# Patient Record
Sex: Male | Born: 1980 | Race: White | Hispanic: No | Marital: Single | Smoking: Current every day smoker
Health system: Southern US, Community
[De-identification: ages and names within clinical notes are randomized; demographics above are authoritative.]

---

## 2008-03-31 ENCOUNTER — Emergency Department (HOSPITAL_BASED_OUTPATIENT_CLINIC_OR_DEPARTMENT_OTHER): Admission: EM | Admit: 2008-03-31 | Discharge: 2008-03-31 | Payer: Self-pay | Admitting: Emergency Medicine

## 2008-07-14 ENCOUNTER — Emergency Department (HOSPITAL_BASED_OUTPATIENT_CLINIC_OR_DEPARTMENT_OTHER): Admission: EM | Admit: 2008-07-14 | Discharge: 2008-07-14 | Payer: Self-pay | Admitting: Emergency Medicine

## 2008-12-27 ENCOUNTER — Ambulatory Visit: Payer: Self-pay | Admitting: Radiology

## 2008-12-27 ENCOUNTER — Emergency Department (HOSPITAL_BASED_OUTPATIENT_CLINIC_OR_DEPARTMENT_OTHER): Admission: EM | Admit: 2008-12-27 | Discharge: 2008-12-27 | Payer: Self-pay | Admitting: Emergency Medicine

## 2009-01-03 ENCOUNTER — Emergency Department (HOSPITAL_BASED_OUTPATIENT_CLINIC_OR_DEPARTMENT_OTHER): Admission: EM | Admit: 2009-01-03 | Discharge: 2009-01-03 | Payer: Self-pay | Admitting: Emergency Medicine

## 2009-05-24 ENCOUNTER — Ambulatory Visit: Payer: Self-pay | Admitting: Family Medicine

## 2009-05-24 DIAGNOSIS — S61409A Unspecified open wound of unspecified hand, initial encounter: Secondary | ICD-10-CM | POA: Insufficient documentation
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
## 2009-08-05 ENCOUNTER — Emergency Department (HOSPITAL_BASED_OUTPATIENT_CLINIC_OR_DEPARTMENT_OTHER): Admission: EM | Admit: 2009-08-05 | Discharge: 2009-08-05 | Payer: Self-pay | Admitting: Emergency Medicine

## 2010-12-05 LAB — URINALYSIS, ROUTINE W REFLEX MICROSCOPIC
Glucose, UA: NEGATIVE mg/dL
Hgb urine dipstick: NEGATIVE
Ketones, ur: NEGATIVE mg/dL
Protein, ur: NEGATIVE mg/dL

## 2010-12-05 LAB — DIFFERENTIAL
Basophils Relative: 2 % — ABNORMAL HIGH (ref 0–1)
Lymphs Abs: 1.9 10*3/uL (ref 0.7–4.0)
Monocytes Relative: 9 % (ref 3–12)
Neutro Abs: 3.7 10*3/uL (ref 1.7–7.7)
Neutrophils Relative %: 59 % (ref 43–77)

## 2010-12-05 LAB — COMPREHENSIVE METABOLIC PANEL
BUN: 13 mg/dL (ref 6–23)
Calcium: 9.6 mg/dL (ref 8.4–10.5)
Glucose, Bld: 94 mg/dL (ref 70–99)
Total Protein: 7.6 g/dL (ref 6.0–8.3)

## 2010-12-05 LAB — PROTIME-INR
INR: 1 (ref 0.00–1.49)
Prothrombin Time: 13 seconds (ref 11.6–15.2)

## 2010-12-05 LAB — CBC
HCT: 44.7 % (ref 39.0–52.0)
Hemoglobin: 15.2 g/dL (ref 13.0–17.0)
MCHC: 34 g/dL (ref 30.0–36.0)
MCV: 92.9 fL (ref 78.0–100.0)
RDW: 12 % (ref 11.5–15.5)

## 2010-12-05 LAB — LIPASE, BLOOD: Lipase: 307 U/L — ABNORMAL HIGH (ref 23–300)

## 2011-05-29 LAB — OCCULT BLOOD X 1 CARD TO LAB, STOOL: Fecal Occult Bld: NEGATIVE

## 2011-08-19 ENCOUNTER — Emergency Department: Admit: 2011-08-19 | Discharge: 2011-08-19 | Disposition: A | Discharge status: Home / Self Care | Payer: Medicaid Other

## 2011-08-19 ENCOUNTER — Emergency Department (INDEPENDENT_AMBULATORY_CARE_PROVIDER_SITE_OTHER)
Admission: EM | Admit: 2011-08-19 | Discharge: 2011-08-19 | Disposition: A | Discharge status: Other Acute Inpatient Hospital | Payer: Medicaid Other | Source: Home / Self Care | Attending: Emergency Medicine | Admitting: Emergency Medicine

## 2011-08-19 ENCOUNTER — Encounter: Payer: Self-pay | Admitting: Emergency Medicine

## 2011-08-19 DIAGNOSIS — M25569 Pain in unspecified knee: Secondary | ICD-10-CM

## 2011-08-19 DIAGNOSIS — S8392XA Sprain of unspecified site of left knee, initial encounter: Secondary | ICD-10-CM

## 2011-08-19 DIAGNOSIS — IMO0002 Reserved for concepts with insufficient information to code with codable children: Secondary | ICD-10-CM

## 2011-08-19 DIAGNOSIS — M25562 Pain in left knee: Secondary | ICD-10-CM

## 2011-08-19 MED ORDER — HYDROCODONE-ACETAMINOPHEN 5-500 MG PO TABS: ORAL_TABLET | ORAL | Status: AC

## 2011-08-19 MED ORDER — ETODOLAC 500 MG PO TABS: 500.0000 mg | ORAL_TABLET | Freq: Two times a day (BID) | ORAL | Status: AC

## 2011-08-19 NOTE — ED Provider Notes (Signed)
History     CSN: [id number]  Arrival date & time 08/19/11  1324   First MD Initiated Contact with Patient 08/19/11 1522      Chief Complaint  Patient presents with  . Knee Pain     Patient is a 30 y.o. male presenting with knee pain. The history is provided by the patient and the spouse.  Knee Pain The current episode started 12 to 24 hours ago. The problem has been gradually worsening (Worsening swelling left knee). Pertinent negatives include no chest pain, no abdominal pain, no headaches and no shortness of breath. The symptoms are aggravated by bending and twisting. The symptoms are relieved by rest. He has tried nothing for the symptoms.   Yesterday, while walking and climbing on the roof he felt left knee pop. The pain has gradually worsened. It's sharp and dull. No radiation. Mild worsening swelling left knee overnight. History reviewed. No pertinent past medical history. Never had any diagnosed with left knee problems in the past. History reviewed. No pertinent past surgical history.  Family History  Problem Relation Age of Onset  . Hypertension Mother   . Hypertension Father     History  Substance Use Topics  . Smoking status: Current Everyday Smoker -- 1.0 packs/day    Types: Cigarettes  . Smokeless tobacco: Not on file  . Alcohol Use: Yes      Review of Systems  Respiratory: Negative for shortness of breath.   Cardiovascular: Negative for chest pain.  Gastrointestinal: Negative for abdominal pain.  Neurological: Negative for headaches.  All other systems reviewed and are negative.    Allergies  Review of patient's allergies indicates no known allergies.  Home Medications   Current Outpatient Rx  Name Route Sig Dispense Refill  . ETODOLAC 500 MG PO TABS Oral Take 1 tablet (500 mg total) by mouth 2 (two) times daily with a meal. As needed for pain. 20 tablet 0  . HYDROCODONE-ACETAMINOPHEN 5-500 MG PO TABS  Take 1 or 2 every 4-6 hours as needed for  severe pain 12 tablet 0    BP 162/97  Pulse 77  Temp(Src) 98.6 F (37 C) (Oral)  Resp 16  Ht 5\' 11"  (1.803 m)  Wt 210 lb (95.255 kg)  BMI 29.29 kg/m2  SpO2 100%  Physical Exam  Nursing note and vitals reviewed. Constitutional: He is oriented to person, place, and time. He appears well-developed and well-nourished. No distress.       Very uncomfortable from left knee pain, splinting his left knee. Unable to weight-bear on left knee without severe pain.  HENT:  Head: Normocephalic and atraumatic.  Eyes: Conjunctivae and EOM are normal. Pupils are equal, round, and reactive to light. No scleral icterus.  Neck: Normal range of motion.  Cardiovascular: Normal rate.   Pulmonary/Chest: Effort normal.  Abdominal: He exhibits no distension.  Musculoskeletal:       Left knee: He exhibits decreased range of motion and swelling. He exhibits no laceration and no erythema. tenderness found. Medial joint line tenderness noted.       Mildly positive McMurray sign left knee. Negative anterior drawer sign left knee. There is a minimal effusion left knee that ballots.  Neurological: He is alert and oriented to person, place, and time.  Skin: Skin is warm.  Psychiatric: He has a normal mood and affect.   no skin changes suggestive of cellulitis or infection. No fluctuance or heat.  ED Course  Procedures (including critical care time) 9:22 PM--x-ray left  knee ordered  Labs Reviewed - No data to display Dg Knee Complete 4 Views Left  08/19/2011  *RADIOLOGY REPORT*  Clinical Data: Injury 1 day ago.  Pain.  LEFT KNEE - COMPLETE 4+ VIEW  Comparison: None.  Findings: The patient has a small to moderate joint effusion.  No fracture or dislocation is identified.  IMPRESSION: Joint effusion.  Otherwise negative.  Original Report Authenticated By: Bernadene Bell. D'ALESSIO, M.D.     1. Knee pain, left   2. Sprain of left knee       MDM  May have a simple sprain of left knee, but I'm concerned he may  have medial meniscus injury, or possibly torn internal ligament such as anterior cruciate ligament. Discussed this and the negative left knee x-ray with patient and wife. Placed him in a knee immobilizer. Prescribed Lodine and a small amount of Vicodin when necessary pain. Precautions discussed. EncourGave him names and numbers of orthopedist. Advised to followup with an orthopedist in 3-5 days, sooner when necessary. In the meantime, wear R knee brace and right leg. rest, ice, compression with ACE bandage, and elevation of injured body part.  I told him and wife that if he is unable to get an appointment with an orthopedist in the next 3 days, then our office will assist with referral to an orthopedist          Lonell Face, MD 08/19/11 2126

## 2011-08-19 NOTE — ED Notes (Signed)
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

## 2011-08-22 ENCOUNTER — Telehealth: Payer: Self-pay | Admitting: Emergency Medicine

## 2011-08-22 NOTE — ED Notes (Signed)
Made appt with Dr Charlann Boxer for Friday Jan. 18, 2013 Called patient at number on chart and got no answer and no answering machine. Was unable to leave a message.

## 2011-08-23 ENCOUNTER — Telehealth: Payer: Self-pay | Admitting: Family Medicine

## 2016-07-22 ENCOUNTER — Encounter (HOSPITAL_BASED_OUTPATIENT_CLINIC_OR_DEPARTMENT_OTHER): Payer: Self-pay | Admitting: *Deleted

## 2016-07-22 ENCOUNTER — Emergency Department (HOSPITAL_BASED_OUTPATIENT_CLINIC_OR_DEPARTMENT_OTHER)
Admission: EM | Admit: 2016-07-22 | Discharge: 2016-07-22 | Discharge status: Against Medical Advice | Payer: Self-pay | Attending: Emergency Medicine | Admitting: Emergency Medicine

## 2016-07-22 ENCOUNTER — Emergency Department (HOSPITAL_BASED_OUTPATIENT_CLINIC_OR_DEPARTMENT_OTHER): Payer: Self-pay

## 2016-07-22 DIAGNOSIS — Z23 Encounter for immunization: Secondary | ICD-10-CM | POA: Insufficient documentation

## 2016-07-22 DIAGNOSIS — S68125A Partial traumatic metacarpophalangeal amputation of left ring finger, initial encounter: Secondary | ICD-10-CM | POA: Insufficient documentation

## 2016-07-22 DIAGNOSIS — F17210 Nicotine dependence, cigarettes, uncomplicated: Secondary | ICD-10-CM | POA: Insufficient documentation

## 2016-07-22 DIAGNOSIS — Y929 Unspecified place or not applicable: Secondary | ICD-10-CM | POA: Insufficient documentation

## 2016-07-22 DIAGNOSIS — Y9389 Activity, other specified: Secondary | ICD-10-CM | POA: Insufficient documentation

## 2016-07-22 DIAGNOSIS — W230XXA Caught, crushed, jammed, or pinched between moving objects, initial encounter: Secondary | ICD-10-CM | POA: Insufficient documentation

## 2016-07-22 DIAGNOSIS — Y990 Civilian activity done for income or pay: Secondary | ICD-10-CM | POA: Insufficient documentation

## 2016-07-22 DIAGNOSIS — Z791 Long term (current) use of non-steroidal anti-inflammatories (NSAID): Secondary | ICD-10-CM | POA: Insufficient documentation

## 2016-07-22 DIAGNOSIS — S6992XA Unspecified injury of left wrist, hand and finger(s), initial encounter: Secondary | ICD-10-CM

## 2016-07-22 MED ORDER — CEPHALEXIN 500 MG PO CAPS: 500.0000 mg | ORAL_CAPSULE | Freq: Three times a day (TID) | ORAL | 0 refills | Status: AC

## 2016-07-22 MED ORDER — TETANUS-DIPHTH-ACELL PERTUSSIS 5-2.5-18.5 LF-MCG/0.5 IM SUSP
0.5000 mL | Freq: Once | INTRAMUSCULAR | Status: AC
  Administered 2016-07-22: 0.5 mL via INTRAMUSCULAR
  Filled 2016-07-22: qty 0.5

## 2016-07-22 NOTE — ED Notes (Signed)
Pt is anxious and states he wants to go home. MD made aware and spoke to pt regarding risks of leaving. Pt adamant that he wants to leave.

## 2016-07-22 NOTE — ED Notes (Signed)
Patient transported to X-ray 

## 2016-07-22 NOTE — ED Provider Notes (Signed)
[facility] Note   CSN: [id number] Arrival date & time: 07/22/16  1621  By signing my name below, I, Arianna Nassar, attest that this documentation has been prepared under the direction and in the presence of Nira ConnPedro Eduardo Cardama, MD.  Electronically Signed: Octavia HeirArianna Nassar, ED Scribe. 07/22/16. 4:48 PM.    History   Chief Complaint Chief Complaint  Patient presents with  . Finger Injury    left fourth      The history is provided by the patient. No language interpreter was used.   HPI Comments: Dylan Moore is a 35 y.o. male who presents to the Emergency Department presenting with a left ring finger injury that occurred ~ 1 hour ago. Pt was at work when he was pushing a trailer and crushed his finger in the process. He also haPt has a partial amputation of the left fourth finger with bleeding currently controlled. He notes taking 600 mg of ibuprofen and a few shots of liquor to relieve his pain with mild relief.  There are no other complaints. Pt is not up to date on his tetanus shot.  History reviewed. No pertinent past medical history.  Patient Active Problem List   Diagnosis Date Noted  . LACERATION, HAND, LEFT 05/24/2009    History reviewed. No pertinent surgical history.     Home Medications    Prior to Admission medications   Medication Sig Start Date End Date Taking? Authorizing Provider  ibuprofen (ADVIL,MOTRIN) 600 MG tablet Take 600 mg by mouth every 6 (six) hours as needed.   Yes Historical Provider, MD  cephALEXin (KEFLEX) 500 MG capsule Take 1 capsule (500 mg total) by mouth 3 (three) times daily. 07/22/16 07/29/16  Nira ConnPedro Eduardo Cardama, MD    Family History Family History  Problem Relation Age of Onset  . Hypertension Father   . Hypertension Mother     Social History Social History  Substance Use Topics  . Smoking status: Current Every Day Smoker    Packs/day: 1.00    Types: Cigarettes  . Smokeless tobacco: Never Used  .  Alcohol use Yes     Allergies   Other   Review of Systems Review of Systems  All other systems reviewed and are negative.   A complete 10 system review of systems was obtained and all systems are negative except as noted in the HPI and PMH.   Physical Exam Updated Vital Signs BP (!) 183/118 (BP Location: Left Arm)   Pulse 87   Temp 98.3 F (36.8 C) (Oral)   Resp 20   Ht 5\' 11"  (1.803 m)   Wt 180 lb (81.6 kg)   SpO2 98%   BMI 25.10 kg/m   Physical Exam  Constitutional: He is oriented to person, place, and time. He appears well-developed and well-nourished. No distress.  HENT:  Head: Normocephalic and atraumatic.  Right Ear: External ear normal.  Left Ear: External ear normal.  Nose: Nose normal.  Mouth/Throat: Mucous membranes are normal. No trismus in the jaw.  Eyes: Conjunctivae and EOM are normal. No scleral icterus.  Neck: Normal range of motion and phonation normal.  Cardiovascular: Normal rate and regular rhythm.   Pulmonary/Chest: Effort normal. No stridor. No respiratory distress.  Abdominal: He exhibits no distension.  Musculoskeletal: Normal range of motion. He exhibits no edema.  Blood blister to left middle finger, partial amputation to the tip of the left ring finger involving the ungual region   Neurological: He is alert and oriented to  person, place, and time.  Skin: He is not diaphoretic.  Psychiatric: He has a normal mood and affect. His behavior is normal.  Vitals reviewed.    ED Treatments / Results  DIAGNOSTIC STUDIES: Oxygen Saturation is 98% on RA, normal by my interpretation.  COORDINATION OF CARE:  4:44 PM Discussed treatment plan with pt at bedside and pt agreed to plan.  Labs (all labs ordered are listed, but only abnormal results are displayed) Labs Reviewed - No data to display  EKG  EKG Interpretation None       Radiology No results found.  Procedures Procedures (including critical care time)  Medications Ordered in  ED Medications  Tdap (BOOSTRIX) injection 0.5 mL (0.5 mLs Intramuscular Given 07/22/16 1656)     Initial Impression / Assessment and Plan / ED Course  I have reviewed the triage vital signs and the nursing notes.  Pertinent labs & imaging results that were available during my care of the patient were reviewed by me and considered in my medical decision making (see chart for details).  Clinical Course as of Jul 22 1702  Wynelle LinkSun Jul 22, 2016  1702 Prior to obtaining plain film and patient requested to leave AGAINST MEDICAL ADVICE. We discussed the risks of leaving without complete evaluation and appropriate treatment. Patient expressed understanding and still requesting to leave. Patient agreed to get his tetanus booster and stay for her discharge paperwork so that we can provide him with prophylactic antibiotics and contact information to the hand surgeon. Patient declined wound irrigation here.  The patient is safe for discharge with strict return precautions.   [PC]    Clinical Course User Index [PC] Nira ConnPedro Eduardo Cardama, MD     I personally performed the services described in this documentation, which was scribed in my presence. The recorded information has been reviewed and is accurate.     Final Clinical Impressions(s) / ED Diagnoses   Final diagnoses:  Finger injury, left, initial encounter   Disposition: Discharged AMA  Condition: Good  I have discussed the results, Dx and Tx plan with the patient who expressed understanding and agree(s) with the plan. Discharge instructions discussed at great length. The patient was given strict return precautions who verbalized understanding of the instructions. No further questions at time of discharge.    New Prescriptions   CEPHALEXIN (KEFLEX) 500 MG CAPSULE    Take 1 capsule (500 mg total) by mouth 3 (three) times daily.    Follow Up: Bradly BienenstockFred Ortmann, MD [address] [place] [id number] [phone]  Schedule  an appointment as soon as possible for a visit  as soon as possible forclose follow up to assess for left finger injury  [facility] [address] [place] [id number] [phone] Call  For help establishing care with a care provider      Nira ConnPedro Eduardo Cardama, MD 07/22/16 1704

## 2016-07-22 NOTE — ED Notes (Signed)
Pt tool 600mg  ibuprofen PTA and some shots of liquor, pt refusing any stronger pain meds.

## 2016-07-22 NOTE — ED Triage Notes (Signed)
Partial amputation of left fourth finger.  Crushed in a trailer moving equipment.

## 2016-07-22 NOTE — ED Notes (Signed)
Pt states he doesn't want to have vital signs rechecked. Pt anxious, but calm and pleasant. Pt verbalized understanding of risks involved with leaving AMA. Pt states he still wishes to leave at this time.
